# Patient Record
Sex: Female | Born: 1960 | Hispanic: Yes | Marital: Single | Smoking: Former smoker
Health system: Southern US, Community
[De-identification: ages and names within clinical notes are randomized; demographics above are authoritative.]

## PROBLEM LIST (undated history)

## (undated) DIAGNOSIS — R109 Unspecified abdominal pain: Secondary | ICD-10-CM

## (undated) DIAGNOSIS — R7303 Prediabetes: Secondary | ICD-10-CM

## (undated) DIAGNOSIS — R040 Epistaxis: Secondary | ICD-10-CM

## (undated) DIAGNOSIS — F4320 Adjustment disorder, unspecified: Secondary | ICD-10-CM

## (undated) HISTORY — PX: OTHER SURGICAL HISTORY: SHX169

---

## 2014-01-14 ENCOUNTER — Ambulatory Visit: Payer: Self-pay | Admitting: Family Medicine

## 2016-01-09 ENCOUNTER — Other Ambulatory Visit: Payer: Self-pay | Admitting: Family Medicine

## 2016-01-09 DIAGNOSIS — Z1231 Encounter for screening mammogram for malignant neoplasm of breast: Secondary | ICD-10-CM

## 2016-11-15 ENCOUNTER — Encounter: Payer: Self-pay | Admitting: *Deleted

## 2016-11-18 ENCOUNTER — Encounter
Admission: RE | Disposition: A | Discharge status: Home / Self Care | Payer: Self-pay | Source: Ambulatory Visit | Attending: Gastroenterology

## 2016-11-18 ENCOUNTER — Ambulatory Visit
Admission: RE | Admit: 2016-11-18 | Discharge: 2016-11-18 | Disposition: A | Discharge status: Home / Self Care | Payer: BLUE CROSS/BLUE SHIELD | Source: Ambulatory Visit | Attending: Gastroenterology | Admitting: Gastroenterology

## 2016-11-18 ENCOUNTER — Ambulatory Visit: Payer: BLUE CROSS/BLUE SHIELD | Admitting: Certified Registered Nurse Anesthetist

## 2016-11-18 ENCOUNTER — Encounter: Payer: Self-pay | Admitting: *Deleted

## 2016-11-18 DIAGNOSIS — R7303 Prediabetes: Secondary | ICD-10-CM | POA: Diagnosis not present

## 2016-11-18 DIAGNOSIS — K621 Rectal polyp: Secondary | ICD-10-CM | POA: Insufficient documentation

## 2016-11-18 DIAGNOSIS — Z1211 Encounter for screening for malignant neoplasm of colon: Secondary | ICD-10-CM | POA: Insufficient documentation

## 2016-11-18 DIAGNOSIS — Q438 Other specified congenital malformations of intestine: Secondary | ICD-10-CM | POA: Diagnosis not present

## 2016-11-18 HISTORY — DX: Adjustment disorder, unspecified: F43.20

## 2016-11-18 HISTORY — PX: COLONOSCOPY WITH PROPOFOL: SHX5780

## 2016-11-18 HISTORY — DX: Unspecified abdominal pain: R10.9

## 2016-11-18 HISTORY — DX: Epistaxis: R04.0

## 2016-11-18 HISTORY — DX: Prediabetes: R73.03

## 2016-11-18 SURGERY — COLONOSCOPY WITH PROPOFOL
Anesthesia: General

## 2016-11-18 MED ORDER — PROPOFOL 500 MG/50ML IV EMUL
INTRAVENOUS | Status: DC | PRN
  Administered 2016-11-18: 140 ug/kg/min via INTRAVENOUS

## 2016-11-18 MED ORDER — PHENYLEPHRINE HCL 10 MG/ML IJ SOLN
INTRAMUSCULAR | Status: AC
  Filled 2016-11-18: qty 1

## 2016-11-18 MED ORDER — LIDOCAINE HCL (CARDIAC) 20 MG/ML IV SOLN
INTRAVENOUS | Status: DC | PRN
  Administered 2016-11-18: 80 mg via INTRAVENOUS

## 2016-11-18 MED ORDER — LIDOCAINE HCL (PF) 2 % IJ SOLN
INTRAMUSCULAR | Status: AC
  Filled 2016-11-18: qty 2

## 2016-11-18 MED ORDER — GLYCOPYRROLATE 0.2 MG/ML IJ SOLN
INTRAMUSCULAR | Status: AC
  Filled 2016-11-18: qty 1

## 2016-11-18 MED ORDER — PROPOFOL 10 MG/ML IV BOLUS
INTRAVENOUS | Status: DC | PRN
  Administered 2016-11-18: 70 mg via INTRAVENOUS
  Administered 2016-11-18: 30 mg via INTRAVENOUS

## 2016-11-18 MED ORDER — SODIUM CHLORIDE 0.9 % IV SOLN: INTRAVENOUS | Status: DC

## 2016-11-18 MED ORDER — EPHEDRINE SULFATE 50 MG/ML IJ SOLN
INTRAMUSCULAR | Status: AC
  Filled 2016-11-18: qty 1

## 2016-11-18 MED ORDER — SODIUM CHLORIDE 0.9 % IV SOLN
INTRAVENOUS | Status: DC
  Administered 2016-11-18: 1000 mL via INTRAVENOUS

## 2016-11-18 NOTE — Anesthesia Preprocedure Evaluation (Signed)
Anesthesia Evaluation  Patient identified by MRN, date of birth, ID band Patient awake    Reviewed: Allergy & Precautions, H&P , NPO status , Patient's Chart, lab work & pertinent test results, reviewed documented beta blocker date and time   History of Anesthesia Complications Negative for: history of anesthetic complications  Airway Mallampati: III  TM Distance: >3 FB Neck ROM: full    Dental  (+) Teeth Intact   Pulmonary neg pulmonary ROS,           Cardiovascular Exercise Tolerance: Good negative cardio ROS       Neuro/Psych negative neurological ROS  negative psych ROS   GI/Hepatic negative GI ROS, Neg liver ROS,   Endo/Other  negative endocrine ROS  Renal/GU negative Renal ROS  negative genitourinary   Musculoskeletal   Abdominal   Peds  Hematology negative hematology ROS (+)   Anesthesia Other Findings Past Medical History: No date: Abdominal pain No date: Adjustment disorder No date: Epistaxis No date: Pre-diabetes   Reproductive/Obstetrics negative OB ROS                             Anesthesia Physical Anesthesia Plan  ASA: I  Anesthesia Plan: General   Post-op Pain Management:    Induction:   Airway Management Planned:   Additional Equipment:   Intra-op Plan:   Post-operative Plan:   Informed Consent: I have reviewed the patients History and Physical, chart, labs and discussed the procedure including the risks, benefits and alternatives for the proposed anesthesia with the patient or authorized representative who has indicated his/her understanding and acceptance.   Dental Advisory Given  Plan Discussed with: Anesthesiologist, CRNA and Surgeon  Anesthesia Plan Comments:         Anesthesia Quick Evaluation

## 2016-11-18 NOTE — Op Note (Signed)
[facility] Gastroenterology Patient Name: Marin OlpFelipa Hernandez Mendoza Procedure Date: 11/18/2016 7:51 AM MRN: [id number] Account #: [id number] Date of Birth: 04/24/1961 Admit Type: Outpatient Age: [age] [facility] Gender: Female Note Status: Finalized Procedure:            Colonoscopy Indications:          Screening for colorectal malignant neoplasm, This is                        the patient's first colonoscopy Providers:            Christena DeemMartin U. Skulskie, MD Referring MD:         No Local Md, MD (Referring MD) Medicines:            Monitored Anesthesia Care Complications:        No immediate complications. Procedure:            Pre-Anesthesia Assessment:                       - ASA Grade Assessment: I - A normal, healthy patient.                       After obtaining informed consent, the colonoscope was                        passed under direct vision. Throughout the procedure,                        the patient's blood pressure, pulse, and oxygen                        saturations were monitored continuously. The                        Colonoscope was introduced through the anus and                        advanced to the the cecum, identified by appendiceal                        orifice and ileocecal valve. The colonoscopy was                        performed with moderate difficulty due to a tortuous                        colon. Successful completion of the procedure was aided                        by changing the patient to a supine position and                        changing the patient to a prone position. The quality                        of the bowel preparation was good. Findings:      Three sessile polyps were found in the rectum. The polyps were 1 to 2 mm       in size.      The digital rectal  exam was normal.      The entire examined colon appeared normal.      The retroflexed view of the distal rectum and anal verge was normal and   showed no anal or rectal abnormalities. Impression:           - Three 1 to 2 mm polyps in the rectum.                       - The entire examined colon is normal. Recommendation:       - Discharge patient to home.                       - Await pathology results.                       - Telephone GI clinic for pathology results in 1 week. Procedure Code(s):    --- Professional ---                       [phone], Colonoscopy, flexible; diagnostic, including                        collection of specimen(s) by brushing or washing, when                        performed (separate procedure) Diagnosis Code(s):    --- Professional ---                       Z12.11, Encounter for screening for malignant neoplasm                        of colon                       K62.1, Rectal polyp CPT copyright 2016 American Medical Association. All rights reserved. The codes documented in this report are preliminary and upon coder review may  be revised to meet current compliance requirements. Christena Deem, MD 11/18/2016 8:20:01 AM This report has been signed electronically. Number of Addenda: 0 Note Initiated On: 11/18/2016 7:51 AM Scope Withdrawal Time: 0 hours 10 minutes 5 seconds  Total Procedure Duration: 0 hours 19 minutes 45 seconds       [facility]

## 2016-11-18 NOTE — Anesthesia Postprocedure Evaluation (Signed)
Anesthesia Post Note  Patient: Holly Murray  Procedure(s) Performed: Procedure(s) (LRB): COLONOSCOPY WITH PROPOFOL (N/A)  Patient location during evaluation: Endoscopy Anesthesia Type: General Level of consciousness: awake and alert Pain management: pain level controlled Vital Signs Assessment: post-procedure vital signs reviewed and stable Respiratory status: spontaneous breathing, nonlabored ventilation, respiratory function stable and patient connected to nasal cannula oxygen Cardiovascular status: blood pressure returned to baseline and stable Postop Assessment: no signs of nausea or vomiting Anesthetic complications: no     Last Vitals:  Vitals:   11/18/16 0842 11/18/16 0852  BP: 132/74 125/67  Pulse: (!) 48 (!) 45  Resp: (!) 21 (!) 21  Temp:      Last Pain:  Vitals:   11/18/16 0832  TempSrc:   PainSc: 2                  Lenard SimmerAndrew Jadi Deyarmin

## 2016-11-18 NOTE — H&P (Signed)
Outpatient short stay form Pre-procedure 11/18/2016 7:43 AM Holly Murray Brooksie Ellwanger MD  Primary Physician: Dr. Shonna ChockNoah Wouk  Reason for visit:  Screening colonoscopy  History of present illness:  Patient is a 56 year old female presenting today as above. This her first colonoscopy. She tolerated her prep well. She takes no aspirin or blood thinning agents.  There is no family history of colon cancer colon polyps.  Current Facility-Administered Medications:  .  0.9 %  sodium chloride infusion, , Intravenous, Continuous, Holly Murray Lathen Seal, MD, Last Rate: 20 mL/hr at 11/18/16 0742, 1,000 mL at 11/18/16 0742 .  0.9 %  sodium chloride infusion, , Intravenous, Continuous, Holly Murray [name], MD  Prescriptions Prior to Admission  Medication Sig Dispense Refill Last Dose  . conjugated estrogens (PREMARIN) vaginal cream Place 1 Applicatorful vaginally daily.        No Known Allergies   Past Medical History:  Diagnosis Date  . Abdominal pain   . Adjustment disorder   . Epistaxis   . Pre-diabetes     Review of systems:      Physical Exam    Heart and lungs: Regular rate and rhythm without rub or gallop, lungs are bilaterally clear.    HEENT: Normocephalic atraumatic eyes are anicteric    Other:     Pertinant exam for procedure: Soft nontender nondistended bowel sounds positive normoactive    Planned proceedures: Colonoscopy and indicated procedures. I have discussed the risks benefits and complications of procedures to include not limited to bleeding, infection, perforation and the risk of sedation and the patient wishes to proceed.    Holly Murray [name], MD Gastroenterology 11/18/2016  7:43 AM

## 2016-11-18 NOTE — Anesthesia Procedure Notes (Signed)
Performed by: Maxten Shuler Pre-anesthesia Checklist: Patient identified, Emergency Drugs available, Suction available, Patient being monitored and Timeout performed Patient Re-evaluated:Patient Re-evaluated prior to inductionOxygen Delivery Method: Nasal cannula Intubation Type: IV induction       

## 2016-11-18 NOTE — Transfer of Care (Signed)
Immediate Anesthesia Transfer of Care Note  Patient: Marin OlpFelipa Hernandez Mendoza  Procedure(s) Performed: Procedure(s): COLONOSCOPY WITH PROPOFOL (N/A)  Patient Location: PACU  Anesthesia Type: General  Level of Consciousness: sedated  Airway & Oxygen Therapy: Patient Spontanous Breathing and Patient connected to nasal cannula oxygen  Post-op Assessment: Report given to RN and Post -op Vital signs reviewed and stable  Post vital signs: Reviewed and stable  Last Vitals:  Vitals:   11/18/16 0728  BP: 127/76  Pulse: (!) 50  Resp: 16  Temp: 36.2 C    Last Pain:  Vitals:   11/18/16 0728  TempSrc: Tympanic         Complications: No apparent anesthesia complications

## 2016-11-18 NOTE — Anesthesia Post-op Follow-up Note (Cosign Needed)
Anesthesia QCDR form completed.        

## 2016-11-19 ENCOUNTER — Encounter: Payer: Self-pay | Admitting: Gastroenterology

## 2016-11-19 LAB — SURGICAL PATHOLOGY

## 2020-02-16 ENCOUNTER — Other Ambulatory Visit: Payer: Self-pay | Admitting: Physician Assistant

## 2020-02-16 DIAGNOSIS — Z1231 Encounter for screening mammogram for malignant neoplasm of breast: Secondary | ICD-10-CM

## 2020-03-14 DIAGNOSIS — Z0000 Encounter for general adult medical examination without abnormal findings: Secondary | ICD-10-CM | POA: Diagnosis not present

## 2020-03-14 DIAGNOSIS — M545 Low back pain: Secondary | ICD-10-CM | POA: Diagnosis not present

## 2020-03-14 DIAGNOSIS — M79601 Pain in right arm: Secondary | ICD-10-CM | POA: Diagnosis not present

## 2020-03-14 DIAGNOSIS — Z6829 Body mass index (BMI) 29.0-29.9, adult: Secondary | ICD-10-CM | POA: Diagnosis not present

## 2020-03-14 DIAGNOSIS — R7303 Prediabetes: Secondary | ICD-10-CM | POA: Diagnosis not present

## 2020-03-14 DIAGNOSIS — G8929 Other chronic pain: Secondary | ICD-10-CM | POA: Diagnosis not present

## 2020-04-18 ENCOUNTER — Ambulatory Visit (INDEPENDENT_AMBULATORY_CARE_PROVIDER_SITE_OTHER): Payer: BLUE CROSS/BLUE SHIELD | Admitting: Podiatry

## 2020-04-18 ENCOUNTER — Encounter: Payer: Self-pay | Admitting: Podiatry

## 2020-04-18 ENCOUNTER — Ambulatory Visit (INDEPENDENT_AMBULATORY_CARE_PROVIDER_SITE_OTHER): Payer: Self-pay

## 2020-04-18 ENCOUNTER — Other Ambulatory Visit: Payer: Self-pay

## 2020-04-18 DIAGNOSIS — M722 Plantar fascial fibromatosis: Secondary | ICD-10-CM

## 2020-04-18 NOTE — Progress Notes (Signed)
  Subjective:  Patient ID: Holly Murray, female    DOB: [birth year],  MRN: [id number]  Chief Complaint  Patient presents with  . Foot Pain    Patient presents today for bilat foot pain x years off and on, right worse than left    59 y.o. female presents with the above complaint.  Patient presents with right heel pain that has been going on for quite some time.  Patient states the right side is worse than left.  The left is actually fine.  She states that mostly when he is walking she gets sharp pains especially in the center arch/heel area.  Is been on for years on and off.  Patient has tried ibuprofen which helps.  She states that nothing else has helped.  She has not seen anyone else prior to see me.  She has not tried any other alleviating factors.   Review of Systems: Negative except as noted in the HPI. Denies N/V/F/Ch.  Past Medical History:  Diagnosis Date  . Abdominal pain   . Adjustment disorder   . Epistaxis   . Pre-diabetes     Current Outpatient Medications:  .  conjugated estrogens (PREMARIN) vaginal cream, Place 1 Applicatorful vaginally daily., Disp: , Rfl:  .  ibuprofen (ADVIL) 200 MG tablet, Take by mouth., Disp: , Rfl:   Social History   Tobacco Use  Smoking Status Never Smoker  Smokeless Tobacco Never Used    No Known Allergies Objective:  There were no vitals filed for this visit. There is no height or weight on file to calculate BMI. Constitutional Well developed. Well nourished.  Vascular Dorsalis pedis pulses palpable bilaterally. Posterior tibial pulses palpable bilaterally. Capillary refill normal to all digits.  No cyanosis or clubbing noted. Pedal hair growth normal.  Neurologic Normal speech. Oriented to person, place, and time. Epicritic sensation to light touch grossly present bilaterally.  Dermatologic Nails well groomed and normal in appearance. No open wounds. No skin lesions.  Orthopedic: Normal joint ROM without pain or  crepitus bilaterally. No visible deformities. Tender to palpation at the calcaneal tuber right. No pain with calcaneal squeeze right. Ankle ROM diminished range of motion right. Silfverskiold Test: positive right.   Radiographs: Taken and reviewed. No acute fractures or dislocations. No evidence of stress fracture.  Plantar heel spur present. Posterior heel spur absent.   Assessment:   1. Plantar fasciitis    Plan:  Patient was evaluated and treated and all questions answered.  Plantar Fasciitis, right - XR reviewed as above.  - Educated on icing and stretching. Instructions given.  - Injection delivered to the plantar fascia as below. - DME: Plantar Fascial Brace x1 - Pharmacologic management: Meloxicam/Medrol Dose Pak. Educated on risks/benefits and proper taking of medication.  Procedure: Injection Tendon/Ligament Location: Right plantar fascia at the glabrous junction; medial approach. Skin Prep: alcohol Injectate: 0.5 cc 0.5% marcaine plain, 0.5 cc of 1% Lidocaine, 0.5 cc kenalog 10. Disposition: Patient tolerated procedure well. Injection site dressed with a band-aid.  No follow-ups on file.

## 2020-04-25 ENCOUNTER — Other Ambulatory Visit: Payer: Self-pay

## 2020-04-25 ENCOUNTER — Ambulatory Visit
Admission: RE | Admit: 2020-04-25 | Discharge: 2020-04-25 | Disposition: A | Discharge status: Home / Self Care | Payer: Self-pay | Source: Ambulatory Visit | Attending: Physician Assistant | Admitting: Physician Assistant

## 2020-04-25 DIAGNOSIS — Z1231 Encounter for screening mammogram for malignant neoplasm of breast: Secondary | ICD-10-CM | POA: Insufficient documentation

## 2020-05-23 ENCOUNTER — Ambulatory Visit: Payer: Self-pay | Admitting: Podiatry

## 2020-05-25 DIAGNOSIS — Z23 Encounter for immunization: Secondary | ICD-10-CM | POA: Diagnosis not present

## 2020-05-25 DIAGNOSIS — G8929 Other chronic pain: Secondary | ICD-10-CM | POA: Diagnosis not present

## 2021-08-22 DIAGNOSIS — Z0000 Encounter for general adult medical examination without abnormal findings: Secondary | ICD-10-CM | POA: Diagnosis not present

## 2021-08-22 DIAGNOSIS — R7303 Prediabetes: Secondary | ICD-10-CM | POA: Diagnosis not present

## 2021-08-22 DIAGNOSIS — Z23 Encounter for immunization: Secondary | ICD-10-CM | POA: Diagnosis not present

## 2021-08-22 DIAGNOSIS — Z1389 Encounter for screening for other disorder: Secondary | ICD-10-CM | POA: Diagnosis not present

## 2021-08-22 DIAGNOSIS — Z124 Encounter for screening for malignant neoplasm of cervix: Secondary | ICD-10-CM | POA: Diagnosis not present

## 2021-08-22 DIAGNOSIS — R1013 Epigastric pain: Secondary | ICD-10-CM | POA: Diagnosis not present

## 2021-08-23 ENCOUNTER — Other Ambulatory Visit: Payer: Self-pay | Admitting: Physician Assistant

## 2021-08-23 DIAGNOSIS — Z1231 Encounter for screening mammogram for malignant neoplasm of breast: Secondary | ICD-10-CM

## 2021-09-25 IMAGING — MG DIGITAL SCREENING BILAT W/ TOMO W/ CAD
8 series · 8 of 24 positions shown · non-contrast
Comparison: Previous exam(s).

CLINICAL DATA: Screening.

EXAM:
DIGITAL SCREENING BILATERAL MAMMOGRAM WITH TOMO AND CAD

[R MLO synth-2D]
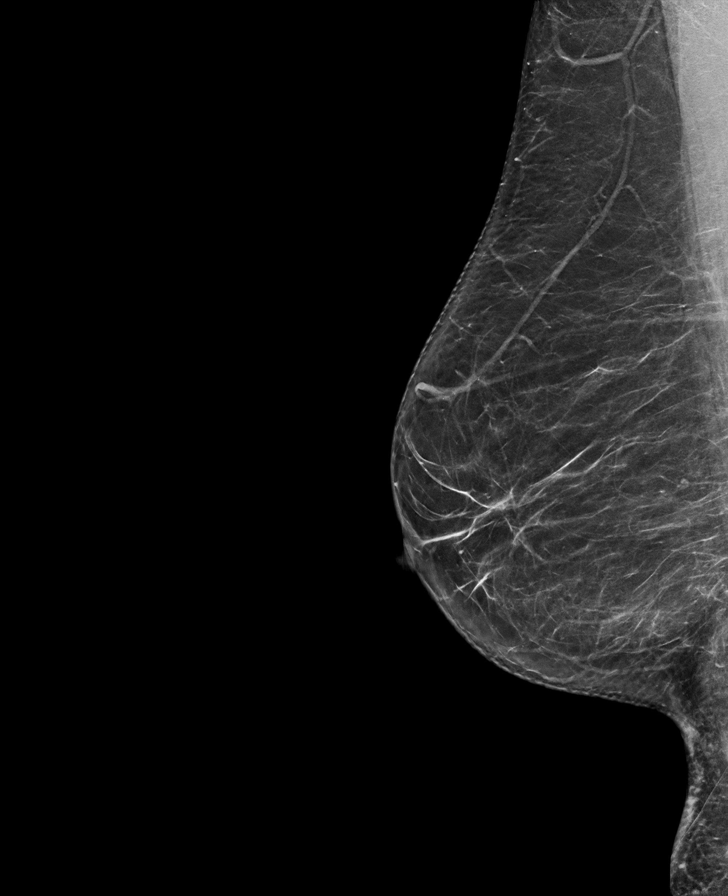

[L MLO synth-2D]
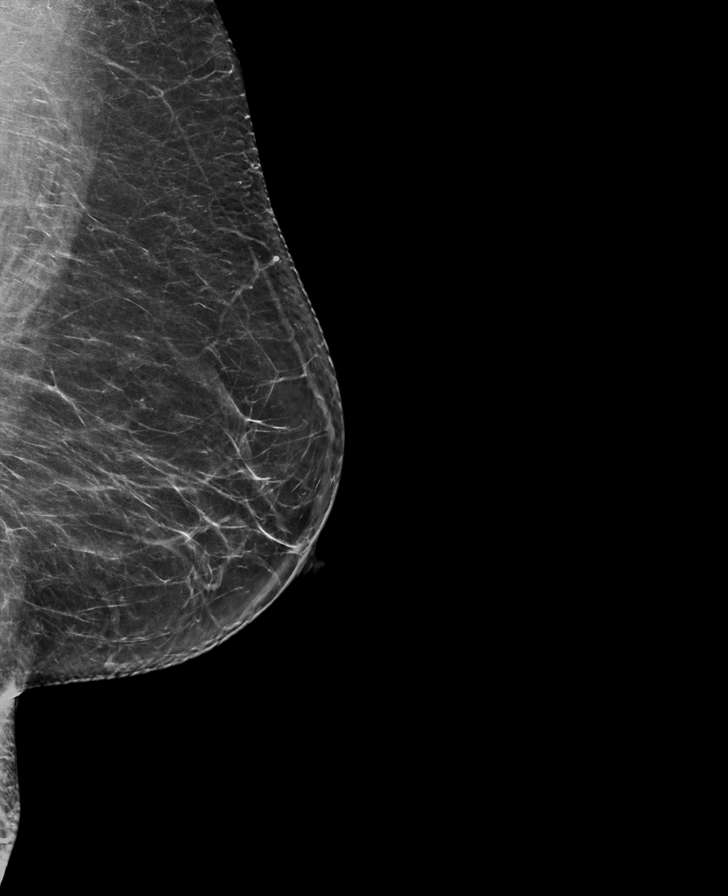

[R CC synth-2D]
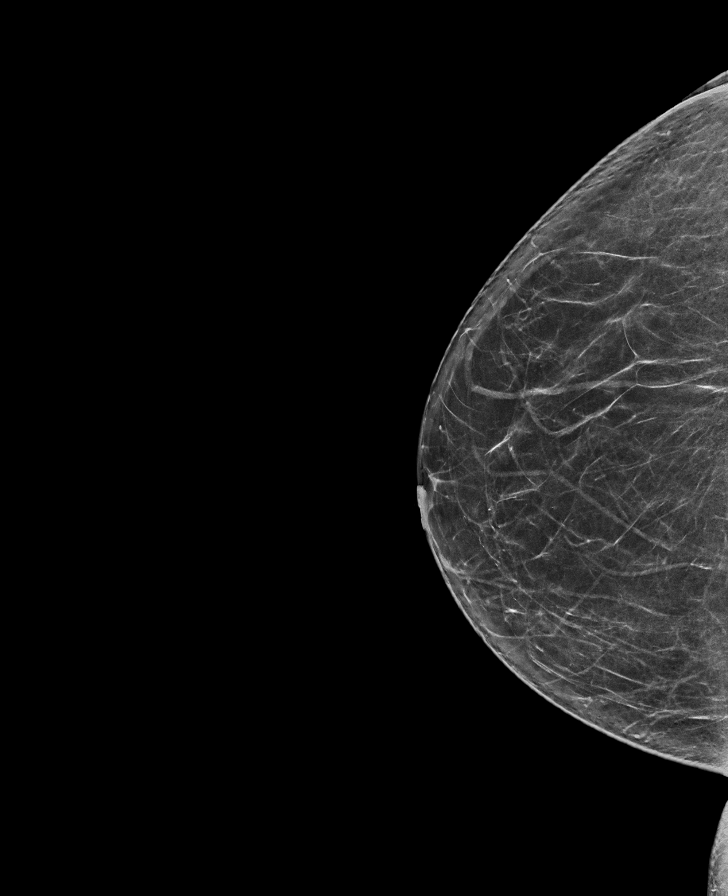

[L CC synth-2D]
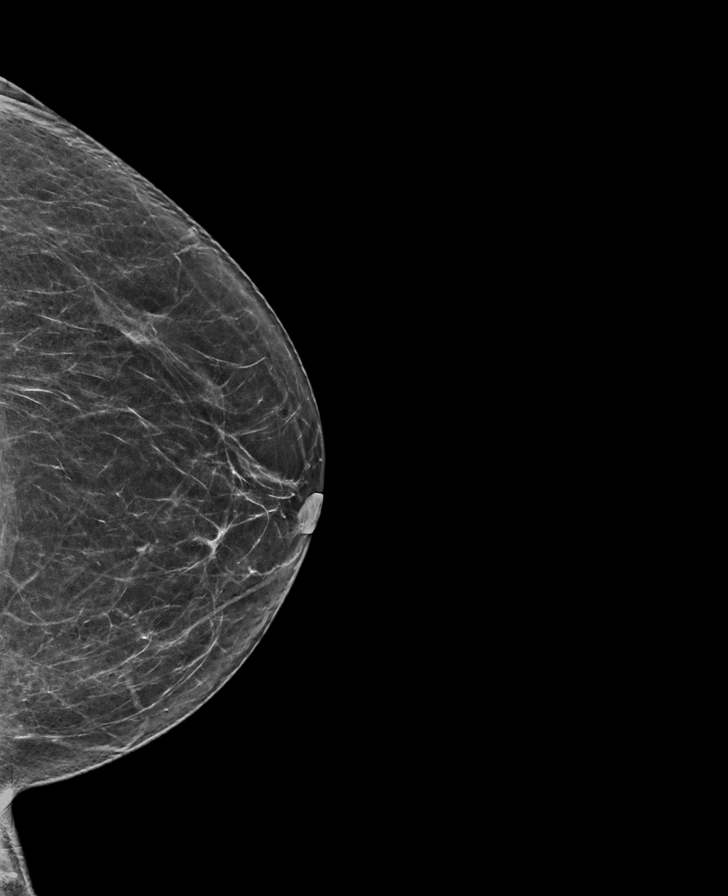

[R CC tomo · tomo slice 37/72.0]
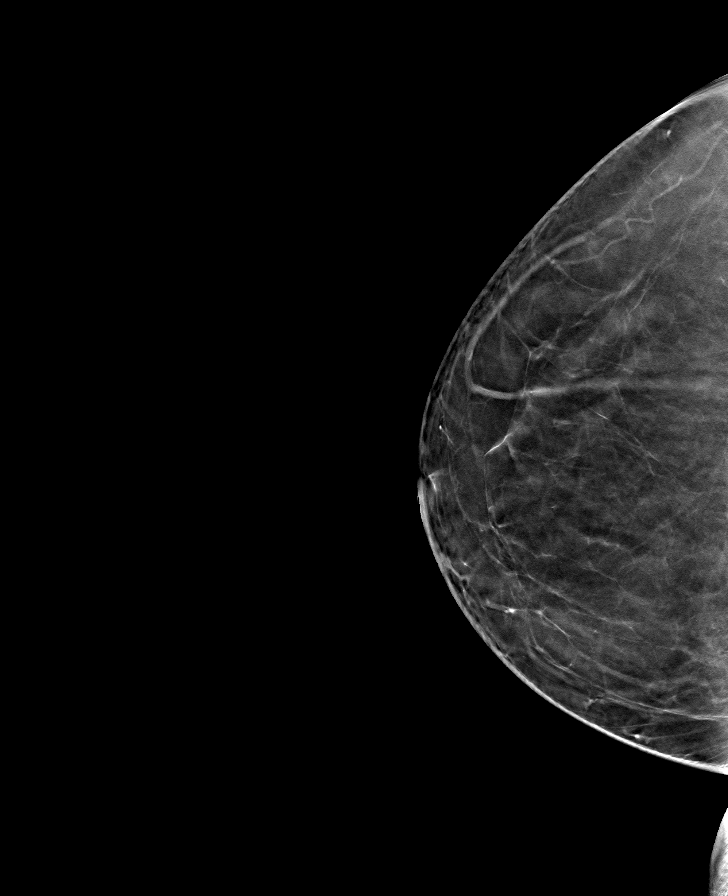

[L CC tomo · tomo slice 35/70.0]
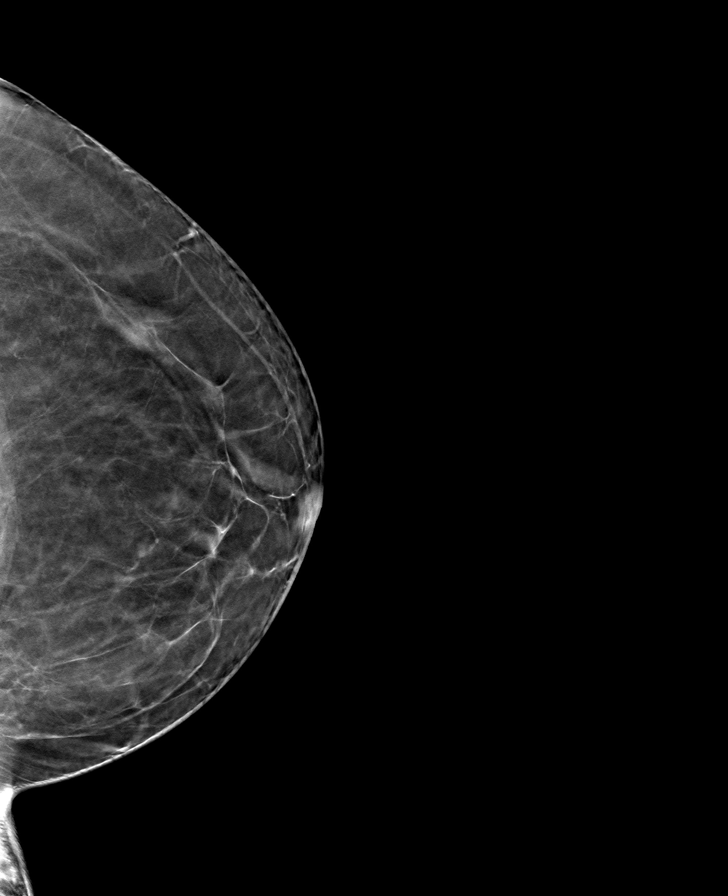

[R MLO tomo · tomo slice 37/73.0]
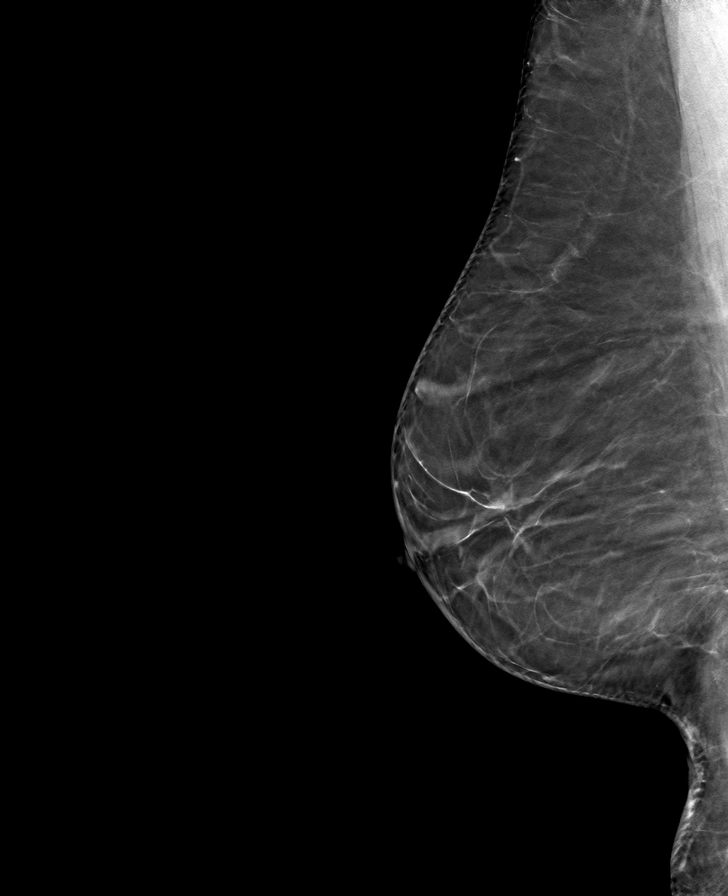

[L MLO tomo · tomo slice 37/74.0]
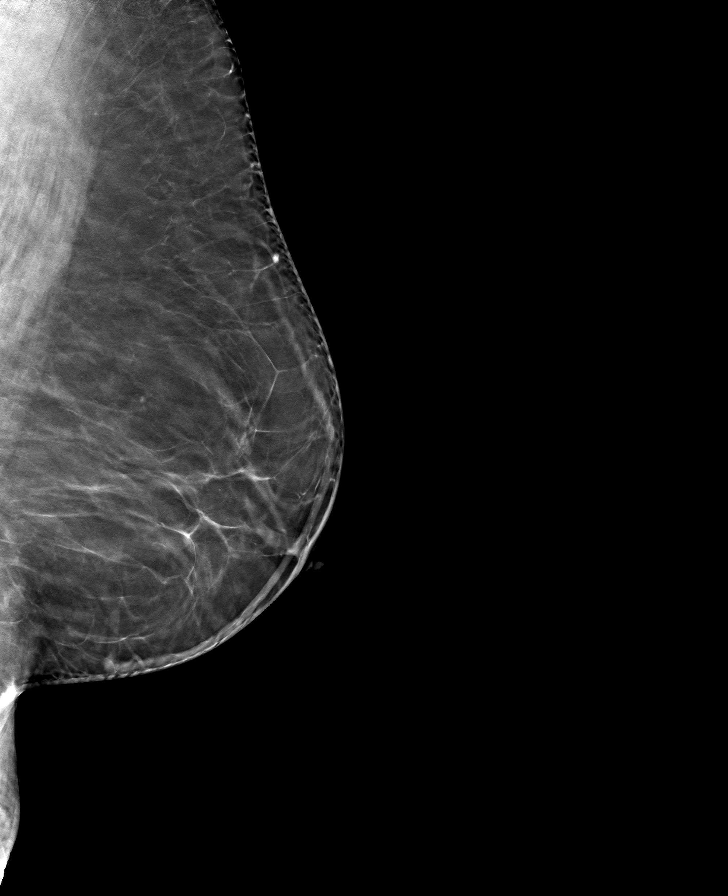

[8 of 24 positions shown; findings below may reference images not displayed]

ACR Breast Density Category b: There are scattered areas of
fibroglandular density.
FINDINGS: There are no findings suspicious for malignancy. Images were
processed with CAD.
IMPRESSION: No mammographic evidence of malignancy. A result letter of this
screening mammogram will be mailed directly to the patient.

RECOMMENDATION:
Screening mammogram in one year. (Code:CN-U-775)

BI-RADS CATEGORY  1: Negative.

## 2021-10-01 DIAGNOSIS — Z0000 Encounter for general adult medical examination without abnormal findings: Secondary | ICD-10-CM | POA: Diagnosis not present

## 2021-10-01 DIAGNOSIS — Z124 Encounter for screening for malignant neoplasm of cervix: Secondary | ICD-10-CM | POA: Diagnosis not present

## 2021-10-01 DIAGNOSIS — Z01419 Encounter for gynecological examination (general) (routine) without abnormal findings: Secondary | ICD-10-CM | POA: Diagnosis not present

## 2021-10-02 DIAGNOSIS — Z124 Encounter for screening for malignant neoplasm of cervix: Secondary | ICD-10-CM | POA: Diagnosis not present

## 2021-11-22 ENCOUNTER — Ambulatory Visit
Admission: RE | Admit: 2021-11-22 | Discharge: 2021-11-22 | Disposition: A | Discharge status: Home / Self Care | Payer: 59 | Source: Ambulatory Visit | Attending: Physician Assistant | Admitting: Physician Assistant

## 2021-11-22 ENCOUNTER — Encounter: Payer: Self-pay | Admitting: Radiology

## 2021-11-22 DIAGNOSIS — Z1231 Encounter for screening mammogram for malignant neoplasm of breast: Secondary | ICD-10-CM | POA: Diagnosis not present

## 2022-09-30 DIAGNOSIS — R030 Elevated blood-pressure reading, without diagnosis of hypertension: Secondary | ICD-10-CM | POA: Diagnosis not present

## 2022-09-30 DIAGNOSIS — Z131 Encounter for screening for diabetes mellitus: Secondary | ICD-10-CM | POA: Diagnosis not present

## 2022-09-30 DIAGNOSIS — Z0000 Encounter for general adult medical examination without abnormal findings: Secondary | ICD-10-CM | POA: Diagnosis not present

## 2022-09-30 DIAGNOSIS — Z1389 Encounter for screening for other disorder: Secondary | ICD-10-CM | POA: Diagnosis not present

## 2022-10-07 ENCOUNTER — Other Ambulatory Visit: Payer: Self-pay | Admitting: Nurse Practitioner

## 2022-10-07 DIAGNOSIS — Z1231 Encounter for screening mammogram for malignant neoplasm of breast: Secondary | ICD-10-CM

## 2022-11-12 ENCOUNTER — Encounter (INDEPENDENT_AMBULATORY_CARE_PROVIDER_SITE_OTHER): Payer: Self-pay | Admitting: Vascular Surgery

## 2022-11-12 ENCOUNTER — Ambulatory Visit (INDEPENDENT_AMBULATORY_CARE_PROVIDER_SITE_OTHER): Payer: 59 | Admitting: Vascular Surgery

## 2022-11-12 VITALS — BP 137/72 | HR 62 | Resp 16 | Wt 141.0 lb

## 2022-11-12 DIAGNOSIS — I83813 Varicose veins of bilateral lower extremities with pain: Secondary | ICD-10-CM

## 2022-11-12 DIAGNOSIS — R7303 Prediabetes: Secondary | ICD-10-CM

## 2022-11-12 NOTE — Assessment & Plan Note (Signed)
blood glucose control important in reducing the progression of atherosclerotic disease. Also, involved in wound healing. On appropriate medications.  

## 2022-11-12 NOTE — Patient Instructions (Signed)
Venas varicosas Varicose Veins Las venas varicosas son venas que se han agrandado y abultado, y se han tornado sinuosas. Suelen aparecer en las piernas. Cules son las causas? La causa de esta afeccin es el dao en las vlvulas de la vena. Estas vlvulas ayudan a que la sangre regrese al corazn. Cuando estn daadas y dejan de funcionar correctamente, la sangre puede ir en direccin inversa y retornar a las venas cerca de la piel, lo que hace que las venas se agranden y se vean sinuosas. La afeccin puede surgir por cualquier factor que haga que la sangre retorne, como un embarazo, una actividad que obligue a estar de pie de forma prolongada o la obesidad. Qu incrementa el riesgo? Los siguientes factores pueden hacer que sea ms propenso a contraer esta afeccin: Permanecer de pie mucho tiempo. Estar embarazada. Tener sobrepeso. Fumar. Haber tenido una trombosis venosa profunda previa o tener un trastorno trombtico. Envejecimiento. El riesgo aumenta con la edad. Tener una afeccin llamada sndrome de [place]. Cules son los signos o sntomas? Los sntomas de esta afeccin incluyen: Venas abultadas, sinuosas y azuladas. Sensacin de pesadez en las piernas. Estos sntomas pueden empeorar hacia el final del da. Dolor en las piernas. Estos sntomas pueden empeorar hacia el final del da. Hinchazn en la pierna. Cambios en el color de la piel que est sobre las venas. La hinchazn o el dolor en las piernas pueden limitar sus actividades. Los sntomas pueden empeorar al estar sentado o de pie durante largos perodos. Cmo se diagnostica? Esta afeccin se puede diagnosticar en funcin de lo siguiente: Sus sntomas, antecedentes familiares, niveles de actividad y estilo de vida. Un examen fsico. Tambin pueden hacerle estudios como una ecografa o radiografa. Cmo se trata? El tratamiento de esta afeccin puede incluir lo siguiente: Evitar estar sentado o de pie en la  misma posicin durante mucho tiempo. Usar medias de compresin. Estas medias ayudan a evitar la formacin de cogulos de sangre y a reducir la hinchazn de las piernas. Levantar (elevar) las piernas al descansar. Bajar de peso. Hacer actividad fsica con regularidad. Si tiene sntomas persistentes o desea mejorar el aspecto de las venas varicosas, puede optar por un procedimiento para anular dichas venas o extraerlas. Entre los tratamientos no quirrgicos para anular las venas, se incluyen los siguientes: Escleroterapia. En este tratamiento, se inyecta una solucin en la vena para anularla. Tratamiento con lser. La vena se calienta con un lser para anularla. Ablacin venosa por radiofrecuencia. Se usa una corriente elctrica que se produce mediante ondas de radio para anular la vena. Entre los tratamientos quirrgicos para extraer las venas, se incluyen los siguientes: Flebectoma. En este procedimiento, las venas se extraen a travs de pequeas incisiones que se realizan por encima de las venas. Ligadura venosa y varicectoma. En este procedimiento, se realizan incisiones por encima de las venas. Luego, las venas se extraen despus de atarse (ligarse) con puntos (suturas). Siga estas instrucciones en su casa: Medicamentos Tome los medicamentos de venta libre y los recetados solamente como se lo haya indicado el mdico. Si le recetaron un antibitico, tmelo como se lo haya indicado el mdico. No deje de usar el antibitico aunque comience a sentirse mejor. Actividad Camine todo lo que pueda. Caminar aumenta el flujo sanguneo. Esto ayuda a que la sangre regrese al corazn y alivia la presin en las venas. No permanezca de pie o sentado en una misma posicin durante mucho tiempo. No se siente con las piernas cruzadas. Evite estar sentado durante largos perodos   sin moverse. Levntese y camine un poco cada 1 a 2 horas. Esto es importante para mejorar el flujo sanguneo y la respiracin. Pida  ayuda si se siente dbil o inestable. Retome sus actividades normales segn lo indicado por el mdico. Pregntele al mdico qu actividades son seguras para usted. Haga ejercicio como se lo haya indicado el mdico. Instrucciones generales  Siga las instrucciones del mdico en lo que respecta a la dieta. De noche, eleve las piernas a una altura superior al nivel del corazn. Si se corta en la piel que est por encima de una vena varicosa y esta sangra: Recustese con la pierna levantada. Coloque un pao limpio sobre el corte y presione firmemente sobre l hasta que el sangrado se detenga. Aplique una venda (vendaje) sobre el corte. Beba suficiente lquido como para mantener la orina de color amarillo plido. No consuma ningn producto que contenga nicotina o tabaco. Estos productos incluyen cigarrillos, tabaco para mascar y aparatos de vapeo, como los cigarrillos electrnicos. Si necesita ayuda para dejar de fumar, consulte al mdico. Use medias de compresin como se lo haya indicado su mdico. No use otra clase de vestimenta ajustada alrededor de las piernas, la pelvis o la cintura. Concurra a todas las visitas de seguimiento. Esto es importante. Comunquese con un mdico si: La piel alrededor de las venas varicosas empieza a agrietarse. Siente ms dolor o tiene enrojecimiento, sensibilidad o hinchazn dura sobre la vena. Est incmodo debido al dolor. Se corta en la piel de encima de una vena varicosa y no deja de sangrar. Solicite ayuda de inmediato si: Siente dolor en el pecho. Tiene dificultad para respirar. Siente dolor intenso en la pierna. Resumen Las venas varicosas son venas que se han agrandado y abultado, y se han tornado sinuosas. Suelen aparecer en las piernas. La causa de esta afeccin es el dao en las vlvulas de la vena. Estas vlvulas ayudan a que la sangre regrese al corazn. El tratamiento de esta afeccin incluye hacer movimientos frecuentes, usar medias de compresin,  perder peso y hacer ejercicios de forma regular. En algunos casos, se realizan procedimientos que anulan o extraen las venas. Los tratamientos no quirrgicos para cerrar las venas incluyen escleroterapia, terapia lser y ablacin venosa por radiofrecuencia. Esta informacin no tiene como fin reemplazar el consejo del mdico. Asegrese de hacerle al mdico cualquier pregunta que tenga. Document Revised: 02/12/2021 Document Reviewed: 02/12/2021 Elsevier Patient Education  2023 [employer]  

## 2022-11-12 NOTE — Progress Notes (Signed)
Patient ID: Holly Murray, female   DOB: 1961-07-22, 62 y.o.   MRN: [id number]  Chief Complaint  Patient presents with   New Patient (Initial Visit)    Ref Clydene Laming consult bilateral le varicose veins    HPI Holly Murray is a 62 y.o. female.  I am asked to see the patient by Thomasene Ripple for evaluation of painful varicose veins of the lower extremities.  The patient presents with complaints of symptomatic varicosities of the legs. The patient reports a long standing history of varicosities and they have become painful over time. The patient does have a previous history of phlebitis in the right lower extremity which is the more severely affected of the 2 legs.  The right leg has always been the more severly affected. The patient elevates the legs for relief. The pain is described as aching and burning of the lower legs as well as overlying varicosities. The symptoms are generally most severe in the evening, particularly when they have been on their feet for long periods of time.  Elevation has been used to try to improve the symptoms with limited success. The patient complains of worsening and now basically daily swelling as an associated symptom.      Past Medical History:  Diagnosis Date   Abdominal pain    Adjustment disorder    Epistaxis    Pre-diabetes     Past Surgical History:  Procedure Laterality Date   BTL     CESAREAN SECTION     COLONOSCOPY WITH PROPOFOL N/A 11/18/2016   Procedure: COLONOSCOPY WITH PROPOFOL;  Surgeon: Lollie Sails, MD;  Location: [facility] ENDOSCOPY;  Service: Endoscopy;  Laterality: N/A;    Family History No bleeding disorders, clotting disorders, autoimmune diseases, or aneurysms   Social History   Tobacco Use   Smoking status: Never   Smokeless tobacco: Never  Substance Use Topics   Alcohol use: Yes    Comment: ocassionally   Drug use: Never  No alcohol or IV drug use  No Known Allergies  Current Outpatient Medications   Medication Sig Dispense Refill   ibuprofen (ADVIL) 200 MG tablet Take by mouth.     conjugated estrogens (PREMARIN) vaginal cream Place 1 Applicatorful vaginally daily. (Patient not taking: Reported on 11/12/2022)     No current facility-administered medications for this visit.      REVIEW OF SYSTEMS (Negative unless checked)  Constitutional: [] Weight loss  [] Fever  [] Chills Cardiac: [] Chest pain   [] Chest pressure   [] Palpitations   [] Shortness of breath when laying flat   [] Shortness of breath at rest   [] Shortness of breath with exertion. Vascular:  [x] Pain in legs with walking   [x] Pain in legs at rest   [] Pain in legs when laying flat   [] Claudication   [] Pain in feet when walking  [] Pain in feet at rest  [] Pain in feet when laying flat   [] History of DVT   [] Phlebitis   [x] Swelling in legs   [x] Varicose veins   [] Non-healing ulcers Pulmonary:   [] Uses home oxygen   [] Productive cough   [] Hemoptysis   [] Wheeze  [] COPD   [] Asthma Neurologic:  [] Dizziness  [] Blackouts   [] Seizures   [] History of stroke   [] History of TIA  [] Aphasia   [] Temporary blindness   [] Dysphagia   [] Weakness or numbness in arms   [] Weakness or numbness in legs Musculoskeletal:  [] Arthritis   [] Joint swelling   [] Joint pain   [] Low back pain Hematologic:  []   Easy bruising  [] Easy bleeding   [] Hypercoagulable state   [] Anemic  [] Hepatitis Gastrointestinal:  [] Blood in stool   [] Vomiting blood  [] Gastroesophageal reflux/heartburn   [] Abdominal pain Genitourinary:  [] Chronic kidney disease   [] Difficult urination  [] Frequent urination  [] Burning with urination   [] Hematuria Skin:  [] Rashes   [] Ulcers   [] Wounds Psychological:  [] History of anxiety   []  History of major depression.    Physical Exam BP 137/72 (BP Location: Right Arm)   Pulse 62   Resp 16   Wt 141 lb (64 kg)   BMI 27.54 kg/m  Gen:  WD/WN, NAD Head: [place]/AT, No temporalis wasting.  Ear/Nose/Throat: Hearing grossly intact, dentition good Eyes:  Sclera non-icteric. Conjunctiva clear Neck: Supple. Trachea midline Pulmonary:  Good air movement, no use of accessory muscles, respirations not labored.  Cardiac: RRR, No JVD Vascular: Varicosities extensive and large and measuring up to 4 mm in the right lower extremity        Varicosities extensive and measuring up to 2-3 mm in the left lower extremity Vessel Right Left  Radial Palpable Palpable                          PT Palpable Palpable  DP Palpable Palpable   Gastrointestinal: soft, non-tender/non-distended.  Musculoskeletal: M/S 5/5 throughout.   1 + RLE edema.  1 + LLE edema Neurologic: Sensation grossly intact in extremities.  Symmetrical.  Speech is fluent.  Psychiatric: Judgment intact, Mood & affect appropriate for pt's clinical situation. Dermatologic: No rashes or ulcers noted.  No cellulitis or open wounds.    Radiology No results found.  Labs No results found for this or any previous visit (from the past 2160 hour(s)).  Assessment/Plan:  Pre-diabetes blood glucose control important in reducing the progression of atherosclerotic disease. Also, involved in wound healing. On appropriate medications.   Varicose veins of leg with pain, bilateral  The patient has symptoms consistent with chronic venous insufficiency. We discussed the natural history and treatment options for venous disease. I recommended the regular use of 20 - 30 mm Hg compression stockings, and prescribed these today. I recommended leg elevation and anti-inflammatories as needed for pain. I have also recommended a complete venous duplex to assess the venous system for reflux or thrombotic issues. This can be done at the patient's convenience. I will see the patient back in 3 months to assess the response to conservative management, and determine further treatment options.     Leotis Pain 11/12/2022, 12:14 PM   This note was created with Dragon medical transcription system.  Any errors from  dictation are unintentional.

## 2022-11-28 ENCOUNTER — Encounter (INDEPENDENT_AMBULATORY_CARE_PROVIDER_SITE_OTHER): Payer: Self-pay | Admitting: Nurse Practitioner

## 2022-11-28 ENCOUNTER — Ambulatory Visit (INDEPENDENT_AMBULATORY_CARE_PROVIDER_SITE_OTHER): Payer: 59 | Admitting: Nurse Practitioner

## 2022-11-28 ENCOUNTER — Ambulatory Visit (INDEPENDENT_AMBULATORY_CARE_PROVIDER_SITE_OTHER): Payer: 59

## 2022-11-28 VITALS — BP 142/69 | HR 47 | Ht <= 58 in | Wt 139.4 lb

## 2022-11-28 DIAGNOSIS — I83813 Varicose veins of bilateral lower extremities with pain: Secondary | ICD-10-CM

## 2022-12-22 ENCOUNTER — Encounter (INDEPENDENT_AMBULATORY_CARE_PROVIDER_SITE_OTHER): Payer: Self-pay | Admitting: Nurse Practitioner

## 2022-12-22 NOTE — Progress Notes (Signed)
Subjective:    Patient ID: Holly Murray, female    DOB: 15-Apr-1961, 62 y.o.   MRN: [id number] Chief Complaint  Patient presents with   Follow-up    f/u with b.l. reflux    The patient returns for followup evaluation after the initial visit. The patient continues to have pain in the lower extremities with dependency. The pain is lessened with elevation. Graduated compression stockings, Class I (20-30 mmHg), have been worn but the stockings do not eliminate the leg pain. Over-the-counter analgesics do not improve the symptoms. The degree of discomfort continues to interfere with daily activities. The patient notes the pain in the legs is causing problems with daily exercise, at the workplace and even with household activities and maintenance such as standing in the kitchen preparing meals and doing dishes.   Venous ultrasound shows normal deep venous system, no evidence of acute or chronic DVT.  Superficial reflux is present in the bilateral great saphenous veins    Review of Systems  Cardiovascular:  Positive for leg swelling.  All other systems reviewed and are negative.      Objective:   Physical Exam Vitals reviewed.  HENT:     Head: Normocephalic.  Cardiovascular:     Rate and Rhythm: Normal rate.     Pulses: Normal pulses.  Pulmonary:     Effort: Pulmonary effort is normal.  Musculoskeletal:        General: Tenderness present.  Skin:    General: Skin is warm and dry.  Neurological:     Mental Status: She is alert and oriented to person, place, and time.  Psychiatric:        Mood and Affect: Mood normal.        Behavior: Behavior normal.        Thought Content: Thought content normal.        Judgment: Judgment normal.     BP (!) 142/69 (BP Location: Left Arm)   Pulse (!) 47   Ht 4\' 10"  (1.473 m)   Wt 139 lb 6.4 oz (63.2 kg)   BMI 29.13 kg/m   Past Medical History:  Diagnosis Date   Abdominal pain    Adjustment disorder    Epistaxis     Pre-diabetes     Social History   Socioeconomic History   Marital status: Single    Spouse name: Not on file   Number of children: Not on file   Years of education: Not on file   Highest education level: Not on file  Occupational History   Not on file  Tobacco Use   Smoking status: Never   Smokeless tobacco: Never  Substance and Sexual Activity   Alcohol use: Yes    Comment: ocassionally   Drug use: Never   Sexual activity: Not on file  Other Topics Concern   Not on file  Social History Narrative   Not on file   Social Determinants of Health   Financial Resource Strain: Not on file  Food Insecurity: Not on file  Transportation Needs: Not on file  Physical Activity: Not on file  Stress: Not on file  Social Connections: Not on file  Intimate Partner Violence: Not on file    Past Surgical History:  Procedure Laterality Date   BTL     CESAREAN SECTION     COLONOSCOPY WITH PROPOFOL N/A 11/18/2016   Procedure: COLONOSCOPY WITH PROPOFOL;  Surgeon: Christena Deem, MD;  Location[facility] ENDOSCOPY;  Service: Endoscopy;  Laterality: N/A;  History reviewed. No pertinent family history.  No Known Allergies      No data to display            CMP  No results found for: "NA", "K", "CL", "CO2", "GLUCOSE", "BUN", "CREATININE", "CALCIUM", "PROT", "ALBUMIN", "AST", "ALT", "ALKPHOS", "BILITOT", "GFRNONAA", "GFRAA"   No results found.     Assessment & Plan:   1. Varicose veins of leg with pain, bilateral Recommend  I have reviewed my previous  discussion with the patient regarding  varicose veins and why they cause symptoms. Patient will continue  wearing graduated compression stockings class 1 on a daily basis, beginning first thing in the morning and removing them in the evening.  The patient is CEAP [id number].  The patient has been wearing compression for more than 12 weeks with no or little benefit.  The patient has been exercising daily for more than 12 weeks.  The patient has been elevating and taking OTC pain medications for more than 12 weeks.  None of these have have eliminated the pain related to the varicose veins and venous reflux or the discomfort regarding venous congestion.    In addition, behavioral modification including elevation during the day was again discussed and this will continue.  The patient has utilized over the counter pain medications and has been exercising.  However, at this time conservative therapy has not alleviated the patient's symptoms of leg pain and swelling  Recommend: laser ablation of the right and  left great saphenous veins to eliminate the symptoms of pain and swelling of the lower extremities caused by the severe superficial venous reflux disease.    Current Outpatient Medications on File Prior to Visit  Medication Sig Dispense Refill   ibuprofen (ADVIL) 200 MG tablet Take by mouth.     conjugated estrogens (PREMARIN) vaginal cream Place 1 Applicatorful vaginally daily. (Patient not taking: Reported on 11/12/2022)     No current facility-administered medications on file prior to visit.    There are no Patient Instructions on file for this visit. No follow-ups on file.   Georgiana Spinner, NP

## 2022-12-24 DIAGNOSIS — Z1389 Encounter for screening for other disorder: Secondary | ICD-10-CM | POA: Diagnosis not present

## 2022-12-24 DIAGNOSIS — J302 Other seasonal allergic rhinitis: Secondary | ICD-10-CM | POA: Diagnosis not present

## 2023-02-06 DIAGNOSIS — J019 Acute sinusitis, unspecified: Secondary | ICD-10-CM | POA: Diagnosis not present

## 2023-02-06 DIAGNOSIS — Z1389 Encounter for screening for other disorder: Secondary | ICD-10-CM | POA: Diagnosis not present

## 2023-03-21 ENCOUNTER — Telehealth (INDEPENDENT_AMBULATORY_CARE_PROVIDER_SITE_OTHER): Payer: Self-pay

## 2023-03-21 NOTE — Telephone Encounter (Signed)
Spanish interpreter called pt while I was on the phone, pt answered and when she heard that it was for her laser ablation she hung up on the interpreter.  Will try to call back

## 2023-05-07 ENCOUNTER — Telehealth (INDEPENDENT_AMBULATORY_CARE_PROVIDER_SITE_OTHER): Payer: Self-pay

## 2023-05-07 NOTE — Telephone Encounter (Signed)
I called interpretor services and they ATC pt with no answer or VM on home or cell phone.  Unable to LVM will try to call pt again at a later date.

## 2023-05-22 ENCOUNTER — Telehealth (INDEPENDENT_AMBULATORY_CARE_PROVIDER_SITE_OTHER): Payer: Self-pay

## 2023-05-22 NOTE — Telephone Encounter (Signed)
Interpreter LVM for pt to call our office to get Laser appts scheduled

## 2023-10-06 DIAGNOSIS — R7303 Prediabetes: Secondary | ICD-10-CM | POA: Diagnosis not present

## 2023-10-06 DIAGNOSIS — Z131 Encounter for screening for diabetes mellitus: Secondary | ICD-10-CM | POA: Diagnosis not present

## 2023-10-06 DIAGNOSIS — Z23 Encounter for immunization: Secondary | ICD-10-CM | POA: Diagnosis not present

## 2023-10-06 DIAGNOSIS — Z1389 Encounter for screening for other disorder: Secondary | ICD-10-CM | POA: Diagnosis not present

## 2023-10-06 DIAGNOSIS — Z0000 Encounter for general adult medical examination without abnormal findings: Secondary | ICD-10-CM | POA: Diagnosis not present

## 2023-10-06 DIAGNOSIS — R058 Other specified cough: Secondary | ICD-10-CM | POA: Diagnosis not present

## 2023-10-06 DIAGNOSIS — R030 Elevated blood-pressure reading, without diagnosis of hypertension: Secondary | ICD-10-CM | POA: Diagnosis not present

## 2023-10-06 DIAGNOSIS — Z1331 Encounter for screening for depression: Secondary | ICD-10-CM | POA: Diagnosis not present

## 2023-10-06 DIAGNOSIS — R748 Abnormal levels of other serum enzymes: Secondary | ICD-10-CM | POA: Diagnosis not present

## 2023-10-09 ENCOUNTER — Other Ambulatory Visit: Payer: Self-pay | Admitting: Nurse Practitioner

## 2023-10-09 DIAGNOSIS — Z1231 Encounter for screening mammogram for malignant neoplasm of breast: Secondary | ICD-10-CM

## 2023-10-27 DIAGNOSIS — Z0000 Encounter for general adult medical examination without abnormal findings: Secondary | ICD-10-CM | POA: Diagnosis not present

## 2023-10-27 DIAGNOSIS — R030 Elevated blood-pressure reading, without diagnosis of hypertension: Secondary | ICD-10-CM | POA: Diagnosis not present

## 2023-10-27 DIAGNOSIS — Z1389 Encounter for screening for other disorder: Secondary | ICD-10-CM | POA: Diagnosis not present

## 2023-11-28 ENCOUNTER — Other Ambulatory Visit: Payer: Self-pay | Admitting: Internal Medicine

## 2023-11-28 DIAGNOSIS — Z111 Encounter for screening for respiratory tuberculosis: Secondary | ICD-10-CM

## 2023-12-02 ENCOUNTER — Ambulatory Visit
Admission: RE | Admit: 2023-12-02 | Discharge: 2023-12-02 | Disposition: A | Discharge status: Home / Self Care | Source: Ambulatory Visit | Attending: Internal Medicine | Admitting: *Deleted

## 2023-12-02 ENCOUNTER — Ambulatory Visit
Admission: RE | Admit: 2023-12-02 | Discharge: 2023-12-02 | Disposition: A | Discharge status: Home / Self Care | Source: Ambulatory Visit | Attending: Internal Medicine | Admitting: Internal Medicine

## 2023-12-02 DIAGNOSIS — Z111 Encounter for screening for respiratory tuberculosis: Secondary | ICD-10-CM | POA: Insufficient documentation

## 2023-12-02 DIAGNOSIS — A159 Respiratory tuberculosis unspecified: Secondary | ICD-10-CM | POA: Diagnosis not present

## 2023-12-11 ENCOUNTER — Ambulatory Visit (LOCAL_COMMUNITY_HEALTH_CENTER): Payer: Self-pay

## 2023-12-11 DIAGNOSIS — R7612 Nonspecific reaction to cell mediated immunity measurement of gamma interferon antigen response without active tuberculosis: Secondary | ICD-10-CM

## 2023-12-11 NOTE — Progress Notes (Signed)
 Telephone Epi visit. Spanish interpreter utilized through phone call via language line.  The patient is a 63 yo female screened for TB with QFT which was positive on 11/24/23. Follow up chest X-ray was negative on 12/02/23. Pt was referred by Dr. Zulma Hitt.  EPI completed today 12/11/23. Reviewed LTBI vs. Active TB. 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 She plans to start LTBI tx through her PCP, declines tx through health department.  Shellia Dial, RN

## 2023-12-17 DIAGNOSIS — Z1389 Encounter for screening for other disorder: Secondary | ICD-10-CM | POA: Diagnosis not present

## 2023-12-17 DIAGNOSIS — R058 Other specified cough: Secondary | ICD-10-CM | POA: Diagnosis not present

## 2024-01-01 DIAGNOSIS — Z0000 Encounter for general adult medical examination without abnormal findings: Secondary | ICD-10-CM | POA: Diagnosis not present

## 2024-01-01 DIAGNOSIS — D229 Melanocytic nevi, unspecified: Secondary | ICD-10-CM | POA: Diagnosis not present

## 2024-01-01 DIAGNOSIS — Z227 Latent tuberculosis: Secondary | ICD-10-CM | POA: Diagnosis not present

## 2024-01-01 DIAGNOSIS — Z1389 Encounter for screening for other disorder: Secondary | ICD-10-CM | POA: Diagnosis not present

## 2024-01-06 ENCOUNTER — Ambulatory Visit (LOCAL_COMMUNITY_HEALTH_CENTER): Payer: Self-pay

## 2024-01-06 ENCOUNTER — Other Ambulatory Visit: Payer: Self-pay

## 2024-01-06 ENCOUNTER — Telehealth: Payer: Self-pay | Admitting: Family Medicine

## 2024-01-06 DIAGNOSIS — R7612 Nonspecific reaction to cell mediated immunity measurement of gamma interferon antigen response without active tuberculosis: Secondary | ICD-10-CM

## 2024-01-06 NOTE — Telephone Encounter (Signed)
 Please give her a call back her dr wants her to have an appt for tb treament you where not in when I called to see how you would like to set this up

## 2024-01-07 ENCOUNTER — Other Ambulatory Visit: Payer: Self-pay

## 2024-01-07 NOTE — Progress Notes (Signed)
 Phone call from patient requesting an appointment for latent TB treatment at [place] [facility] TB clinic.   Initial evaluation and EPI note completed 12/11/2023. Reviewed today 01/07/2024.  Patient ha previously declined treatment, however, she decided to return to TB clinic for LTBI treatment, appointment scheduled 01/29/2024 at 8:20 am.  Michele Ahle, RN

## 2024-01-07 NOTE — Progress Notes (Signed)
  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   Diagnosis of LTBI and pertinent labs/info: +QFT: 11/24/2023 CXR: 11/27/2023 negative  EPI: Most recent EPI flowsheet 01/07/2024  HIV: Offer at initial visit.  Syphilis: Offer at initial visit.   Therapy: Rifampin 600 mg daily for 4 months Planned LTBI therapy start date: 01/29/2024  PCP: [employer].   Tuberculosis treatment orders  All patients are to be monitored per [place] and county TB policies.   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 Treat for latent TB per the following:  Rifampin 600 mg daily by mouth x 4 months per standing orders ([facility]) per Dr. Alyn Babe.   Monthly or baseline labs if recommended by TB provider. Needs labs if concerning symptoms arise such as persistent rash, itching, nosebleeds, easy bruising, nausea, vomiting, dark colored urine, the patient starts new potentially hepatotoxic medications, or significant alcohol consumption is reported that would require monitoring.  Offer HIV and syphilis at start visit, if declines HIV patient must sign waiver.

## 2024-01-26 DIAGNOSIS — Z227 Latent tuberculosis: Secondary | ICD-10-CM | POA: Insufficient documentation

## 2024-01-28 DIAGNOSIS — L821 Other seborrheic keratosis: Secondary | ICD-10-CM | POA: Diagnosis not present

## 2024-01-29 ENCOUNTER — Ambulatory Visit (LOCAL_COMMUNITY_HEALTH_CENTER): Payer: Self-pay

## 2024-01-29 ENCOUNTER — Other Ambulatory Visit: Payer: Self-pay

## 2024-01-29 VITALS — Wt 130.3 lb

## 2024-01-29 DIAGNOSIS — Z227 Latent tuberculosis: Secondary | ICD-10-CM

## 2024-01-29 LAB — HM HIV SCREENING LAB: HM HIV Screening: NEGATIVE

## 2024-01-29 MED ORDER — RIFAMPIN 300 MG PO CAPS: 600.0000 mg | ORAL_CAPSULE | Freq: Every day | ORAL | Status: AC

## 2024-01-29 NOTE — Progress Notes (Signed)
 Visit for LTBI treatment: Rifampin #1 TB flowsheet completed.  TB v/s LTBI educational fact sheet reviewed and given to patient as well as Rifampin information sheet.  LTBI treatment consent obtained.  Rifampin 300 mg. #60 capsules dispensed. Patient advised to take 2 capsules at the same time every day.  Follow-up appointment scheduled 02/23/2024 for Rifampin #2. Patient advised to contact TB clinic nurse if he develops any TB symptoms or medication side effects at [phone]. Patient states understanding and agrees.  Michele Ahle, RN

## 2024-02-10 ENCOUNTER — Encounter: Payer: Self-pay | Admitting: Surgery

## 2024-02-23 ENCOUNTER — Other Ambulatory Visit: Payer: Self-pay

## 2024-02-23 ENCOUNTER — Ambulatory Visit (LOCAL_COMMUNITY_HEALTH_CENTER)

## 2024-02-23 VITALS — Wt 135.5 lb

## 2024-02-23 DIAGNOSIS — Z227 Latent tuberculosis: Secondary | ICD-10-CM

## 2024-02-23 MED ORDER — RIFAMPIN 300 MG PO CAPS: 600.0000 mg | ORAL_CAPSULE | Freq: Every day | ORAL | Status: AC

## 2024-02-23 NOTE — Addendum Note (Signed)
 Addended by: Trystin Terhune B on: 02/23/2024 04:40 PM   Modules accepted: Orders

## 2024-02-23 NOTE — Progress Notes (Signed)
 In nurse clinic for LTBI / TB Med Management / Rifampin  # 2  / Labs: n/a [employer] [phone]  Taking Rifampin  as prescribed yes Missing any pills: no Advised to complete this bottle before starting bottle that will be dispensed today.   Changes in  Meds: no New meds: no  New Health problems: no  Alcohol: denies. Advised to abstain from alcohol as Rifampin  can damage the liver.   BCM:  Patient postmenopausal.  Denies sexual activity at this time. Condoms declined  The patient was dispensed Rifampin  #60 for 2nd visit today. I provided counseling today regarding the medication. We discussed the medication, the side effects and when to call clinic. Patient given the opportunity to ask questions. Questions answered.    TB contact card given and advised to contact with questions, concerns, side effects.  Rifampin  information sheet given and reviewed.   Next TB med appt scheduled for 03/24/2024 @1 :00 pm. Appt card given.   Kandi KATHEE Glatter, RN

## 2024-02-26 NOTE — Progress Notes (Signed)
 Attestation of Attending Supervision of RN:  I was consulted regarding this patient case and agree with the documentation below.   Dorothyann Helling, MD Clinical Services Medical Director [facility] Department 02/26/24  6:36 PM

## 2024-03-03 DIAGNOSIS — Z1231 Encounter for screening mammogram for malignant neoplasm of breast: Secondary | ICD-10-CM | POA: Diagnosis not present

## 2024-03-24 ENCOUNTER — Encounter

## 2024-03-24 ENCOUNTER — Ambulatory Visit (LOCAL_COMMUNITY_HEALTH_CENTER): Payer: Self-pay

## 2024-03-24 VITALS — Wt 133.5 lb

## 2024-03-24 DIAGNOSIS — Z227 Latent tuberculosis: Secondary | ICD-10-CM

## 2024-03-24 MED ORDER — RIFAMPIN 300 MG PO CAPS: 600.0000 mg | ORAL_CAPSULE | Freq: Every day | ORAL | Status: AC

## 2024-03-24 NOTE — Progress Notes (Signed)
 In nurse clinic for LTBI / TB Med Management / Rifampin  # 3  / Labs: LFT's Interpreter M [place]  Taking Rifampin  as prescribed.  Denies Missing any pills.  Per pt, approx 6 pills (3 day supply) remaining in current bottle.  Advised to complete this bottle before starting bottle that will be dispensed today.   Changes in  Meds: None New meds: none  New Health problems:  Reports headache last week d/t stress, resolved with ibuprofen  States face was yellow for one day last week.  RN noted sclera bilaterally is white  Alcohol: drank beer x 1 approx 2 weeks ago.   Advised to abstain from alcohol as Rifampin  can damage the liver. Verbalizes understanding  BCM: postmenopausal  Consult Dr JINNY Leghorn and informed of patient status, including yellowing of skin and use of alcohol. Orders LFT's today and advises patient to continue Rifampin  as prescribed.   RN discussed provider orders and patient in agreement.   The patient was dispensed Rifampin  300 mg #60(Bottle #3) today Per Dr JINNY Leghorn I provided counseling today regarding the medication. We discussed the medication, the side effects and when to call clinic. Patient given the opportunity to ask questions. Questions answered.    TB contact card given and advised to contact with questions, concerns, side effects.  Rifampin  information sheet offered but patient declined as she has one at home.   Next TB med appt scheduled for 04/21/2024, arrival 3 pm. Appt card given.   RN walked patient to lab for LFT's.  Soriyah Osberg, RN

## 2024-03-25 LAB — HEPATIC FUNCTION PANEL
ALT: 19 IU/L (ref 0–32)
AST: 19 IU/L (ref 0–40)
Albumin: 4.3 g/dL (ref 3.9–4.9)
Alkaline Phosphatase: 135 IU/L — ABNORMAL HIGH (ref 44–121)
Bilirubin Total: <0.2 mg/dL (ref 0.0–1.2)
Bilirubin, Direct: 0.1 mg/dL (ref 0.00–0.40)
Total Protein: 6.4 g/dL (ref 6.0–8.5)

## 2024-03-26 ENCOUNTER — Ambulatory Visit: Payer: Self-pay | Admitting: Surgery

## 2024-03-26 ENCOUNTER — Telehealth: Payer: Self-pay

## 2024-03-26 NOTE — Progress Notes (Signed)
 03/26/2024 Update to orders:  Patient needs monthly LFTs.  Patient reported mild symptoms at last visit that resolved quickly, and had elevated AlkP.   Continue with monthly monitoring of LFTs through completion of rifampin  treatment.   HERLENE DELON HERO, MD

## 2024-03-26 NOTE — Telephone Encounter (Signed)
 Attempted to provide pt with lab results - mostly WNL except elevated Alk Phos, recommendation that she avoid fatty foods, especially during LTBI tx. Utilized Spanish language interpreter to leave a voicemail providing call back number.  Delon LITTIE Primrose, RN

## 2024-03-29 ENCOUNTER — Telehealth: Payer: Self-pay

## 2024-03-29 NOTE — Telephone Encounter (Signed)
 Lab results from 03/24/2024 reviewed by Dr. Herlene. Phone call to patient for follow-up labs results, voice message left requesting patient to contact TB clinic nurse at [phone]. Almarie Metro, RN

## 2024-04-21 ENCOUNTER — Ambulatory Visit (LOCAL_COMMUNITY_HEALTH_CENTER)

## 2024-04-21 ENCOUNTER — Other Ambulatory Visit: Payer: Self-pay

## 2024-04-21 VITALS — Wt 134.0 lb

## 2024-04-21 DIAGNOSIS — Z227 Latent tuberculosis: Secondary | ICD-10-CM

## 2024-04-21 MED ORDER — RIFAMPIN 300 MG PO CAPS: 600.0000 mg | ORAL_CAPSULE | Freq: Every day | ORAL | Status: AC

## 2024-04-21 NOTE — Progress Notes (Signed)
 Patient in clinic for Rifampin  #4 Dispensed #4 and final month of Rifampin  for LTBI tx. Dispensed #60, 300 mg capsules.   Explained to patient that will always be positive on PPD skin test and blood test for exposure to TB. If patient is asked by employer, school, or other institution to take a TB test, patient should supply proof of treatment completion and/or obtain a TB Screening at the health department or at patient's PCP. This information was also provided in writing in the patient's completion letter which was given to the patient today along with a TB treatment completion card.   A completion letter was also sent by fax to the patient's PCP/other providers as follows:[facility].  All completion letters and completion card were sent for scanning into Epic, along with the patient's TB drug record ([id number]).  Patient was advised to contact [facility]/TB control phone for any concerning symptoms or questions. Almarie Metro, RN
